# Patient Record
Sex: Female | Born: 1945 | Hispanic: No | Marital: Single | Smoking: Never smoker
Health system: Southern US, Community
[De-identification: ages and names within clinical notes are randomized; demographics above are authoritative.]

## PROBLEM LIST (undated history)

## (undated) DIAGNOSIS — E119 Type 2 diabetes mellitus without complications: Secondary | ICD-10-CM

## (undated) DIAGNOSIS — G4730 Sleep apnea, unspecified: Secondary | ICD-10-CM

## (undated) DIAGNOSIS — I10 Essential (primary) hypertension: Secondary | ICD-10-CM

## (undated) DIAGNOSIS — F32A Depression, unspecified: Secondary | ICD-10-CM

## (undated) DIAGNOSIS — F329 Major depressive disorder, single episode, unspecified: Secondary | ICD-10-CM

---
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
## 2003-06-17 ENCOUNTER — Other Ambulatory Visit: Admission: RE | Admit: 2003-06-17 | Discharge: 2003-06-17 | Payer: Self-pay | Admitting: Dermatology

## 2019-10-16 ENCOUNTER — Other Ambulatory Visit: Payer: Self-pay | Admitting: Obstetrics and Gynecology

## 2019-10-16 DIAGNOSIS — N6452 Nipple discharge: Secondary | ICD-10-CM

## 2019-10-25 ENCOUNTER — Other Ambulatory Visit: Payer: Self-pay | Admitting: Obstetrics and Gynecology

## 2019-10-25 ENCOUNTER — Ambulatory Visit: Payer: Self-pay

## 2019-10-25 ENCOUNTER — Ambulatory Visit
Admission: RE | Admit: 2019-10-25 | Discharge: 2019-10-25 | Disposition: A | Discharge status: Home / Self Care | Payer: Medicare Other | Source: Ambulatory Visit | Attending: Obstetrics and Gynecology | Admitting: Obstetrics and Gynecology

## 2019-10-25 ENCOUNTER — Other Ambulatory Visit: Payer: Self-pay

## 2019-10-25 DIAGNOSIS — N6452 Nipple discharge: Secondary | ICD-10-CM

## 2019-10-30 ENCOUNTER — Other Ambulatory Visit: Payer: Self-pay

## 2019-10-30 ENCOUNTER — Ambulatory Visit
Admission: RE | Admit: 2019-10-30 | Discharge: 2019-10-30 | Disposition: A | Discharge status: Home / Self Care | Payer: Medicare Other | Source: Ambulatory Visit | Attending: Obstetrics and Gynecology | Admitting: Obstetrics and Gynecology

## 2019-10-30 DIAGNOSIS — N6452 Nipple discharge: Secondary | ICD-10-CM

## 2019-10-30 HISTORY — PX: BREAST BIOPSY: SHX20

## 2019-12-21 ENCOUNTER — Other Ambulatory Visit: Payer: Self-pay | Admitting: General Surgery

## 2020-01-01 ENCOUNTER — Encounter (HOSPITAL_BASED_OUTPATIENT_CLINIC_OR_DEPARTMENT_OTHER): Payer: Self-pay | Admitting: General Surgery

## 2020-01-01 ENCOUNTER — Other Ambulatory Visit: Payer: Self-pay

## 2020-01-04 ENCOUNTER — Other Ambulatory Visit (HOSPITAL_COMMUNITY)
Admission: RE | Admit: 2020-01-04 | Discharge: 2020-01-04 | Disposition: A | Discharge status: Home / Self Care | Payer: Medicare Other | Source: Ambulatory Visit | Attending: General Surgery | Admitting: General Surgery

## 2020-01-04 ENCOUNTER — Encounter (HOSPITAL_BASED_OUTPATIENT_CLINIC_OR_DEPARTMENT_OTHER)
Admission: RE | Admit: 2020-01-04 | Discharge: 2020-01-04 | Disposition: A | Discharge status: Home / Self Care | Payer: Medicare Other | Source: Ambulatory Visit | Attending: General Surgery | Admitting: General Surgery

## 2020-01-04 ENCOUNTER — Other Ambulatory Visit: Payer: Self-pay

## 2020-01-04 DIAGNOSIS — Z20822 Contact with and (suspected) exposure to covid-19: Secondary | ICD-10-CM | POA: Diagnosis not present

## 2020-01-04 DIAGNOSIS — Z01812 Encounter for preprocedural laboratory examination: Secondary | ICD-10-CM | POA: Insufficient documentation

## 2020-01-04 LAB — BASIC METABOLIC PANEL
Anion gap: 9 (ref 5–15)
BUN: 18 mg/dL (ref 8–23)
CO2: 30 mmol/L (ref 22–32)
Calcium: 10.4 mg/dL — ABNORMAL HIGH (ref 8.9–10.3)
Chloride: 98 mmol/L (ref 98–111)
Creatinine, Ser: 0.78 mg/dL (ref 0.44–1.00)
GFR calc Af Amer: >60 mL/min (ref >60)
GFR calc non Af Amer: >60 mL/min (ref >60)
Glucose, Bld: 108 mg/dL — ABNORMAL HIGH (ref 70–99)
Potassium: 4.9 mmol/L (ref 3.5–5.1)
Sodium: 137 mmol/L (ref 135–145)

## 2020-01-04 LAB — SARS CORONAVIRUS 2 (TAT 6-24 HRS): SARS Coronavirus 2: NEGATIVE

## 2020-01-08 ENCOUNTER — Ambulatory Visit (HOSPITAL_BASED_OUTPATIENT_CLINIC_OR_DEPARTMENT_OTHER): Payer: Medicare Other | Admitting: Anesthesiology

## 2020-01-08 ENCOUNTER — Ambulatory Visit (HOSPITAL_BASED_OUTPATIENT_CLINIC_OR_DEPARTMENT_OTHER)
Admission: RE | Admit: 2020-01-08 | Discharge: 2020-01-08 | Disposition: A | Discharge status: Home / Self Care | Payer: Medicare Other | Attending: General Surgery | Admitting: General Surgery

## 2020-01-08 ENCOUNTER — Encounter (HOSPITAL_BASED_OUTPATIENT_CLINIC_OR_DEPARTMENT_OTHER)
Admission: RE | Disposition: A | Discharge status: Home / Self Care | Payer: Self-pay | Source: Home / Self Care | Attending: General Surgery

## 2020-01-08 ENCOUNTER — Other Ambulatory Visit: Payer: Self-pay

## 2020-01-08 ENCOUNTER — Encounter (HOSPITAL_BASED_OUTPATIENT_CLINIC_OR_DEPARTMENT_OTHER): Payer: Self-pay | Admitting: General Surgery

## 2020-01-08 DIAGNOSIS — F419 Anxiety disorder, unspecified: Secondary | ICD-10-CM | POA: Insufficient documentation

## 2020-01-08 DIAGNOSIS — Z86718 Personal history of other venous thrombosis and embolism: Secondary | ICD-10-CM | POA: Insufficient documentation

## 2020-01-08 DIAGNOSIS — Z90710 Acquired absence of both cervix and uterus: Secondary | ICD-10-CM | POA: Insufficient documentation

## 2020-01-08 DIAGNOSIS — I10 Essential (primary) hypertension: Secondary | ICD-10-CM | POA: Diagnosis not present

## 2020-01-08 DIAGNOSIS — E119 Type 2 diabetes mellitus without complications: Secondary | ICD-10-CM | POA: Insufficient documentation

## 2020-01-08 DIAGNOSIS — Z86711 Personal history of pulmonary embolism: Secondary | ICD-10-CM | POA: Insufficient documentation

## 2020-01-08 DIAGNOSIS — Z87891 Personal history of nicotine dependence: Secondary | ICD-10-CM | POA: Insufficient documentation

## 2020-01-08 DIAGNOSIS — Z91040 Latex allergy status: Secondary | ICD-10-CM | POA: Diagnosis not present

## 2020-01-08 DIAGNOSIS — N6452 Nipple discharge: Secondary | ICD-10-CM | POA: Diagnosis present

## 2020-01-08 DIAGNOSIS — Z9049 Acquired absence of other specified parts of digestive tract: Secondary | ICD-10-CM | POA: Diagnosis not present

## 2020-01-08 DIAGNOSIS — F329 Major depressive disorder, single episode, unspecified: Secondary | ICD-10-CM | POA: Insufficient documentation

## 2020-01-08 DIAGNOSIS — Z880 Allergy status to penicillin: Secondary | ICD-10-CM | POA: Diagnosis not present

## 2020-01-08 DIAGNOSIS — Z7901 Long term (current) use of anticoagulants: Secondary | ICD-10-CM | POA: Insufficient documentation

## 2020-01-08 DIAGNOSIS — D242 Benign neoplasm of left breast: Secondary | ICD-10-CM | POA: Insufficient documentation

## 2020-01-08 DIAGNOSIS — Z885 Allergy status to narcotic agent status: Secondary | ICD-10-CM | POA: Insufficient documentation

## 2020-01-08 DIAGNOSIS — G4730 Sleep apnea, unspecified: Secondary | ICD-10-CM | POA: Insufficient documentation

## 2020-01-08 HISTORY — DX: Essential (primary) hypertension: I10

## 2020-01-08 HISTORY — DX: Sleep apnea, unspecified: G47.30

## 2020-01-08 HISTORY — PX: BREAST DUCTAL SYSTEM EXCISION: SHX5242

## 2020-01-08 HISTORY — DX: Type 2 diabetes mellitus without complications: E11.9

## 2020-01-08 HISTORY — DX: Depression, unspecified: F32.A

## 2020-01-08 LAB — GLUCOSE, CAPILLARY
Glucose-Capillary: 137 mg/dL — ABNORMAL HIGH (ref 70–99)
Glucose-Capillary: 83 mg/dL (ref 70–99)

## 2020-01-08 LAB — PROTIME-INR
INR: 1.3 — ABNORMAL HIGH (ref 0.8–1.2)
Prothrombin Time: 15.6 seconds — ABNORMAL HIGH (ref 11.4–15.2)

## 2020-01-08 SURGERY — EXCISION DUCTAL SYSTEM BREAST
Anesthesia: General | Site: Breast | Laterality: Left

## 2020-01-08 MED ORDER — CEFAZOLIN SODIUM-DEXTROSE 2-4 GM/100ML-% IV SOLN
2.0000 g | INTRAVENOUS | Status: AC
  Administered 2020-01-08: 2 g via INTRAVENOUS

## 2020-01-08 MED ORDER — ONDANSETRON HCL 4 MG/2ML IJ SOLN
INTRAMUSCULAR | Status: DC | PRN
  Administered 2020-01-08: 4 mg via INTRAVENOUS

## 2020-01-08 MED ORDER — CEFAZOLIN SODIUM-DEXTROSE 2-4 GM/100ML-% IV SOLN
INTRAVENOUS | Status: AC
  Filled 2020-01-08: qty 100

## 2020-01-08 MED ORDER — LIDOCAINE HCL (CARDIAC) PF 100 MG/5ML IV SOSY
PREFILLED_SYRINGE | INTRAVENOUS | Status: DC | PRN
  Administered 2020-01-08: 60 mg via INTRAVENOUS

## 2020-01-08 MED ORDER — LACTATED RINGERS IV SOLN: INTRAVENOUS | Status: DC

## 2020-01-08 MED ORDER — ACETAMINOPHEN 500 MG PO TABS
1000.0000 mg | ORAL_TABLET | ORAL | Status: AC
  Administered 2020-01-08: 1000 mg via ORAL

## 2020-01-08 MED ORDER — ALBUTEROL SULFATE HFA 108 (90 BASE) MCG/ACT IN AERS
INHALATION_SPRAY | RESPIRATORY_TRACT | Status: DC | PRN
  Administered 2020-01-08 (×2): 5 via RESPIRATORY_TRACT

## 2020-01-08 MED ORDER — PHENYLEPHRINE 40 MCG/ML (10ML) SYRINGE FOR IV PUSH (FOR BLOOD PRESSURE SUPPORT)
PREFILLED_SYRINGE | INTRAVENOUS | Status: DC | PRN
  Administered 2020-01-08: 40 ug via INTRAVENOUS
  Administered 2020-01-08 (×2): 80 ug via INTRAVENOUS

## 2020-01-08 MED ORDER — PROPOFOL 10 MG/ML IV BOLUS
INTRAVENOUS | Status: DC | PRN
  Administered 2020-01-08: 150 mg via INTRAVENOUS

## 2020-01-08 MED ORDER — DEXAMETHASONE SODIUM PHOSPHATE 4 MG/ML IJ SOLN
INTRAMUSCULAR | Status: DC | PRN
  Administered 2020-01-08: 5 mg via INTRAVENOUS

## 2020-01-08 MED ORDER — GABAPENTIN 100 MG PO CAPS
ORAL_CAPSULE | ORAL | Status: AC
  Filled 2020-01-08: qty 1

## 2020-01-08 MED ORDER — BUPIVACAINE HCL (PF) 0.25 % IJ SOLN
INTRAMUSCULAR | Status: DC | PRN
  Administered 2020-01-08: 10 mL

## 2020-01-08 MED ORDER — GABAPENTIN 100 MG PO CAPS
100.0000 mg | ORAL_CAPSULE | ORAL | Status: AC
  Administered 2020-01-08: 100 mg via ORAL

## 2020-01-08 MED ORDER — PROPOFOL 10 MG/ML IV BOLUS
INTRAVENOUS | Status: AC
  Filled 2020-01-08: qty 20

## 2020-01-08 MED ORDER — FENTANYL CITRATE (PF) 100 MCG/2ML IJ SOLN
INTRAMUSCULAR | Status: AC
  Filled 2020-01-08: qty 2

## 2020-01-08 MED ORDER — ACETAMINOPHEN 500 MG PO TABS
ORAL_TABLET | ORAL | Status: AC
  Filled 2020-01-08: qty 2

## 2020-01-08 MED ORDER — SUCCINYLCHOLINE CHLORIDE 20 MG/ML IJ SOLN
INTRAMUSCULAR | Status: DC | PRN
  Administered 2020-01-08: 100 mg via INTRAVENOUS

## 2020-01-08 MED ORDER — FENTANYL CITRATE (PF) 100 MCG/2ML IJ SOLN
INTRAMUSCULAR | Status: DC | PRN
  Administered 2020-01-08: 100 ug via INTRAVENOUS

## 2020-01-08 MED ORDER — PHENYLEPHRINE 40 MCG/ML (10ML) SYRINGE FOR IV PUSH (FOR BLOOD PRESSURE SUPPORT)
PREFILLED_SYRINGE | INTRAVENOUS | Status: AC
  Filled 2020-01-08: qty 10

## 2020-01-08 MED ORDER — EPHEDRINE SULFATE-NACL 50-0.9 MG/10ML-% IV SOSY
PREFILLED_SYRINGE | INTRAVENOUS | Status: DC | PRN
  Administered 2020-01-08 (×5): 10 mg via INTRAVENOUS

## 2020-01-08 SURGICAL SUPPLY — 49 items
ADH SKN CLS APL DERMABOND .7 (GAUZE/BANDAGES/DRESSINGS) ×1
APL PRP STRL LF DISP 70% ISPRP (MISCELLANEOUS) ×1
BINDER BREAST LRG (GAUZE/BANDAGES/DRESSINGS) IMPLANT
BINDER BREAST MEDIUM (GAUZE/BANDAGES/DRESSINGS) IMPLANT
BINDER BREAST XLRG (GAUZE/BANDAGES/DRESSINGS) IMPLANT
BINDER BREAST XXLRG (GAUZE/BANDAGES/DRESSINGS) IMPLANT
BLADE SURG 15 STRL LF DISP TIS (BLADE) ×1 IMPLANT
BLADE SURG 15 STRL SS (BLADE) ×2
CANISTER SUCT 1200ML W/VALVE (MISCELLANEOUS) IMPLANT
CHLORAPREP W/TINT 26 (MISCELLANEOUS) ×2 IMPLANT
CLIP VESOCCLUDE SM WIDE 6/CT (CLIP) IMPLANT
COVER BACK TABLE 60X90IN (DRAPES) ×2 IMPLANT
COVER MAYO STAND STRL (DRAPES) ×2 IMPLANT
COVER WAND RF STERILE (DRAPES) IMPLANT
DECANTER SPIKE VIAL GLASS SM (MISCELLANEOUS) IMPLANT
DERMABOND ADVANCED (GAUZE/BANDAGES/DRESSINGS) ×1
DERMABOND ADVANCED .7 DNX12 (GAUZE/BANDAGES/DRESSINGS) IMPLANT
DRAPE LAPAROSCOPIC ABDOMINAL (DRAPES) ×2 IMPLANT
DRAPE UTILITY XL STRL (DRAPES) ×2 IMPLANT
DRSG TEGADERM 4X4.75 (GAUZE/BANDAGES/DRESSINGS) IMPLANT
ELECT COATED BLADE 2.86 ST (ELECTRODE) ×2 IMPLANT
ELECT REM PT RETURN 9FT ADLT (ELECTROSURGICAL) ×2
ELECTRODE REM PT RTRN 9FT ADLT (ELECTROSURGICAL) ×1 IMPLANT
GAUZE SPONGE 4X4 12PLY STRL LF (GAUZE/BANDAGES/DRESSINGS) ×2 IMPLANT
GLOVE BIO SURGEON STRL SZ7 (GLOVE) ×4 IMPLANT
GLOVE BIOGEL PI IND STRL 7.5 (GLOVE) ×1 IMPLANT
GLOVE BIOGEL PI INDICATOR 7.5 (GLOVE) ×3
GOWN STRL REUS W/ TWL LRG LVL3 (GOWN DISPOSABLE) ×3 IMPLANT
GOWN STRL REUS W/TWL LRG LVL3 (GOWN DISPOSABLE) ×6
NDL HYPO 25X1 1.5 SAFETY (NEEDLE) ×1 IMPLANT
NEEDLE HYPO 25X1 1.5 SAFETY (NEEDLE) ×2 IMPLANT
NS IRRIG 1000ML POUR BTL (IV SOLUTION) IMPLANT
PACK BASIN DAY SURGERY FS (CUSTOM PROCEDURE TRAY) ×2 IMPLANT
PENCIL SMOKE EVACUATOR (MISCELLANEOUS) ×2 IMPLANT
RETRACTOR ONETRAX LX 90X20 (MISCELLANEOUS) IMPLANT
SLEEVE SCD COMPRESS KNEE MED (MISCELLANEOUS) ×2 IMPLANT
SPONGE LAP 4X18 RFD (DISPOSABLE) ×2 IMPLANT
STRIP CLOSURE SKIN 1/2X4 (GAUZE/BANDAGES/DRESSINGS) IMPLANT
SUT MNCRL AB 4-0 PS2 18 (SUTURE) IMPLANT
SUT SILK 2 0 SH (SUTURE) ×2 IMPLANT
SUT VIC AB 2-0 SH 27 (SUTURE) ×2
SUT VIC AB 2-0 SH 27XBRD (SUTURE) ×1 IMPLANT
SUT VIC AB 3-0 SH 27 (SUTURE) ×2
SUT VIC AB 3-0 SH 27X BRD (SUTURE) ×1 IMPLANT
SUT VIC AB 5-0 PS2 18 (SUTURE) ×1 IMPLANT
SYR CONTROL 10ML LL (SYRINGE) ×2 IMPLANT
TOWEL GREEN STERILE FF (TOWEL DISPOSABLE) ×2 IMPLANT
TUBE CONNECTING 20X1/4 (TUBING) ×1 IMPLANT
YANKAUER SUCT BULB TIP NO VENT (SUCTIONS) ×1 IMPLANT

## 2020-01-08 NOTE — Anesthesia Preprocedure Evaluation (Addendum)
Anesthesia Evaluation  Patient identified by MRN, date of birth, ID band Patient awake    Reviewed: Allergy & Precautions, NPO status , Patient's Chart, lab work & pertinent test results, reviewed documented beta blocker date and time   Airway Mallampati: II  TM Distance: >3 FB Neck ROM: Full    Dental no notable dental hx. (+) Dental Advisory Given, Partial Upper,    Pulmonary sleep apnea ,    Pulmonary exam normal breath sounds clear to auscultation       Cardiovascular hypertension, Pt. on medications and Pt. 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   Neuro/Psych PSYCHIATRIC DISORDERS Depression negative neurological ROS     GI/Hepatic negative GI ROS, Neg liver ROS,   Endo/Other  diabetes, Type 2, Oral Hypoglycemic Agents  Renal/GU negative Renal ROS  negative genitourinary   Musculoskeletal negative musculoskeletal ROS (+)   Abdominal Normal abdominal exam  (+)   Peds negative pediatric ROS (+)  Hematology negative hematology ROS (+)   Anesthesia Other Findings   Reproductive/Obstetrics negative OB ROS                            Anesthesia Physical Anesthesia Plan  ASA: III  Anesthesia Plan: General   Post-op Pain Management:    Induction: Intravenous  PONV Risk Score and Plan: 3 and Ondansetron, Dexamethasone and Treatment may vary due to age or medical condition  Airway Management Planned: LMA  Additional Equipment: None  Intra-op Plan:   Post-operative Plan: Extubation in OR  Informed Consent: I have reviewed the patients History and Physical, chart, labs and discussed the procedure including the risks, benefits and alternatives for the proposed anesthesia with the patient or authorized representative who has indicated his/her understanding and acceptance.     Dental advisory given  Plan  Discussed with: CRNA  Anesthesia Plan Comments:         Anesthesia Quick Evaluation

## 2020-01-08 NOTE — Anesthesia Procedure Notes (Signed)
Procedure Name: Intubation Date/Time: 01/08/2020 1:46 PM Performed by: Raenette Rover, CRNA Pre-anesthesia Checklist: Patient identified, Emergency Drugs available, Suction available and Patient being monitored Patient Re-evaluated:Patient Re-evaluated prior to induction Oxygen Delivery Method: Circle system utilized Preoxygenation: Pre-oxygenation with 100% oxygen Induction Type: IV induction Laryngoscope Size: Mac and 3 Grade View: Grade I Tube type: Oral Tube size: 7.0 mm Number of attempts: 1 Airway Equipment and Method: Stylet Placement Confirmation: ETT inserted through vocal cords under direct vision,  positive ETCO2 and breath sounds checked- equal and bilateral Secured at: 22 cm Tube secured with: Tape Dental Injury: Teeth and Oropharynx as per pre-operative assessment

## 2020-01-08 NOTE — Op Note (Signed)
Preoperative diagnosis:left breast bloody nipple discharge Postoperative diagnosis: Same as above Procedure:Leftbreastcentral duct excision Surgeon: Dr. Serita Grammes Anesthesia: General  Specimens:left breast central duct excision marked with paint Estimated blood [id number] cc Complications: None Drains: None Sponge and count was correct at completion Disposition to recovery in stable condition  Indications:73 yof referred by Dr Vista Deck for left nipple dc.she is on coumadin for remote dvt history. she has noted spontaneous left bloody nipple dc about four weeks ago when she woke up with brb on the sheets. there was soreness associated with it. it leaked again several days after that as well. she has no trauma. mm shows b density breasts. right is negative. left is negative. US shows a left breast retroareolar dilated duct with 5 mm mass vs debris. biopsy was recommended. 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 this was concordant.   Procedure: After informed consent was obtained shewas placed under general anesthesia after transitioning from an LMA due to laryngospasm. She was given antibiotics. SCDs were in place. She was then prepped and draped in the standard sterile surgical fashion. A surgical timeout was then performed.  I infilltrated marcaine around the areola. I then located the discharging duct. I cannulated this with a lacrimal duct probe.  I then made a periareolar incision. I proceeding to remove the central duct system including the lacrimal duct probe and this system of ducts.  I painted this and passed off the table. I then obtained hemostasis. I closed this with 2-0 vicryl, 3-0 vicryl and 5-0 monocryl. I then placed glue and steristrips.

## 2020-01-08 NOTE — Transfer of Care (Signed)
Immediate Anesthesia Transfer of Care Note  Patient: Gabrielle Diaz  Procedure(s) Performed: left breast central duct excision (Left Breast)  Patient Location: PACU  Anesthesia Type:General  Level of Consciousness: awake, alert , oriented and patient cooperative  Airway & Oxygen Therapy: Patient Spontanous Breathing and Patient connected to face mask oxygen  Post-op Assessment: Report given to RN and Post -op Vital signs reviewed and stable  Post vital signs: Reviewed and stable  Last Vitals:  Vitals Value Taken Time  BP 137/76 01/08/20 1418  Temp 36.4 C 01/08/20 1418  Pulse 93 01/08/20 1428  Resp 24 01/08/20 1428  SpO2 100 % 01/08/20 1428  Vitals shown include unvalidated device data.  Last Pain:  Vitals:   01/08/20 1418  TempSrc:   PainSc: 0-No pain      Patients Stated Pain Goal: 3 ([id number])  Complications: No apparent anesthesia complications

## 2020-01-08 NOTE — H&P (Signed)
74 yof referred by Dr Vista Deck for left nipple dc. she is friend of a patient of mine. she is on coumadin for remote dvt history. she has noted spontaneous left bloody nipple dc about four weeks ago when she woke up with brb on the sheets. there was soreness associated with it. it leaked again several days after that as well. she has no trauma. mm shows b density breasts. right is negative. left is negative. US shows a left breast retroareolar dilated duct with 5 mm mass vs debris. biopsy was recommended. 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 this was concordant. she was referred to see me  Past Surgical History Sabino Gasser, [place]; 12/11/2019 9:57 AM) Breast Biopsy  Left. Cesarean Section - Multiple  Gallbladder Surgery - Open  Hysterectomy (not due to cancer) - Complete   Diagnostic Studies History Sabino Gasser, [place]; 12/11/2019 9:57 AM) Colonoscopy  5-10 years ago Mammogram  within last year Pap Smear  >5 years ago  Allergies Sabino Gasser, [place]; 12/11/2019 10:00 AM) Codeine/Codeine Derivatives  Penicillins  Latex  Allergies Reconciled   Social History Sabino Gasser, CMA; 12/11/2019 9:57 AM) Alcohol use  Remotely quit alcohol use. Caffeine use  Carbonated beverages, Coffee. No drug use  Tobacco use  Former smoker.  Family History Sabino Gasser, [place]; 12/11/2019 9:57 AM) Alcohol Abuse  Mother. Arthritis  Father, Mother, Son. Cerebrovascular Accident  Father. Heart Disease  Father. Migraine Headache  Father.  Pregnancy / Birth History Sabino Gasser, [place]; 12/11/2019 9:57 AM) Age at menarche  74 years. Age of menopause  <45 Gravida  4 Length (months) of breastfeeding  3-6 Maternal age  27-30 Para  3  Other Problems Sabino Gasser, [place]; 12/11/2019 9:57 AM) Alcohol Abuse  Anxiety Disorder  Cancer  Cholelithiasis  Depression  Diabetes Mellitus  High blood pressure  Oophorectomy   Bilateral. Pulmonary Embolism / Blood Clot in Legs  Sleep Apnea     Review of Systems Sabino Gasser CMA; 12/11/2019 9:57 AM) General Not Present- Appetite Loss, Chills, Fatigue, Fever, Night Sweats, Weight Gain and Weight Loss. Skin Not Present- Change in Wart/Mole, Dryness, Hives, Jaundice, New Lesions, Non-Healing Wounds, Rash and Ulcer. HEENT Present- Wears glasses/contact lenses. Not Present- Earache, Hearing Loss, Hoarseness, Nose Bleed, Oral Ulcers, Ringing in the Ears, Seasonal Allergies, Sinus Pain, Sore Throat, Visual Disturbances and Yellow Eyes. Respiratory Present- Snoring. Not Present- Bloody sputum, Chronic Cough, Difficulty Breathing and Wheezing. Breast Present- Nipple Discharge. Not Present- Breast Mass, Breast Pain and Skin Changes. Cardiovascular Not Present- Chest Pain, Difficulty Breathing Lying Down, Leg Cramps, Palpitations, Rapid Heart Rate, Shortness of Breath and Swelling of Extremities. Gastrointestinal Not Present- Abdominal Pain, Bloating, Bloody Stool, Change in Bowel Habits, Chronic diarrhea, Constipation, Difficulty Swallowing, Excessive gas, Gets full quickly at meals, Hemorrhoids, Indigestion, Nausea, Rectal Pain and Vomiting. Female Genitourinary Present- Frequency. Not Present- Nocturia, Painful Urination, Pelvic Pain and Urgency. Musculoskeletal Present- Joint Pain, Joint Stiffness, Muscle Pain and Muscle Weakness. Not Present- Back Pain and Swelling of Extremities. Neurological Present- Trouble walking and Weakness. Not Present- Decreased Memory, Fainting, Headaches, Numbness, Seizures, Tingling and Tremor. Psychiatric Present- Anxiety and Depression. Not Present- Bipolar, Change in Sleep Pattern, Fearful and Frequent crying. Hematology Present- Blood Thinners. Not Present- Easy Bruising, Excessive bleeding, Gland problems, HIV and Persistent Infections.  Vitals Sabino Gasser CMA; 12/11/2019 10:01 AM) 12/11/2019 10:00 AM Weight: 184 lb Height: 60in Body  Surface Area: 1.8 m Body Mass Index: 35.93 kg/m  Temp.:  97.78F(Tympanic)  Pulse: 87 (Regular)  BP: 130/82 (Sitting, Left Arm, Standard) Physical Exam Rolm Bookbinder MD; 12/11/2019 2:23 PM) General Mental Status-Alert. Orientation-Oriented X3. Eye Sclera/Conjunctiva - Bilateral-No scleral icterus. Chest and Lung Exam Chest and lung exam reveals -quiet, even and easy respiratory effort with no use of accessory muscles. Breast Breast Lump-No Palpable Breast Mass. Note: left breast with bloody nipple dc expressed from single duct Neurologic Neurologic evaluation reveals -alert and oriented x 3 with no impairment of recent or remote memory. Lymphatic Head & Neck General Head & Neck Lymphatics: Bilateral - Description - Normal. Axillary General Axillary Region: Bilateral - Description - Normal. Note: no [place] adenopathy   Assessment & Plan Rolm Bookbinder MD; 12/12/2019 5:50 PM) BLOODY DISCHARGE FROM LEFT NIPPLE (N64.52) Story: Left central duct excision we discussed options including observation vs surgery. she understands there is small chance of malignancy although likely benign given ongoing bloody unilateral spontaneous discharge. I discussed surgery as outpatient and removing the discharging ductal system. risks and recovery discussed. will check with her pcp about preop issues and then schedule. will ask for coumadin recs.

## 2020-01-08 NOTE — Anesthesia Postprocedure Evaluation (Signed)
Anesthesia Post Note  Patient: Hilton Algiere  Procedure(s) Performed: left breast central duct excision (Left Breast)     Patient location during evaluation: PACU Anesthesia Type: General Level of consciousness: awake and alert, oriented and patient cooperative Pain management: pain level controlled Vital Signs Assessment: post-procedure vital signs reviewed and stable Respiratory status: spontaneous breathing, nonlabored ventilation and respiratory function stable Cardiovascular status: blood pressure returned to baseline and stable Postop Assessment: no apparent nausea or vomiting Anesthetic complications: no    Last Vitals:  Vitals:   01/08/20 1222 01/08/20 1418  BP: 122/85 137/76  Pulse: 73 91  Resp: 18 12  Temp: (!) 36.3 C 36.4 C  SpO2: 99% 92%    Last Pain:  Vitals:   01/08/20 1418  TempSrc:   PainSc: 0-No pain                 Pervis Hocking

## 2020-01-08 NOTE — Interval H&P Note (Signed)
History and Physical Interval Note:  01/08/2020 1:23 PM  Gabrielle Diaz  has presented today for surgery, with the diagnosis of left breast bloody nipple n/c.  The various methods of treatment have been discussed with the patient and family. After consideration of risks, benefits and other options for treatment, the patient has consented to  Procedure(s) with comments: left breast central duct excision (Left) - general and lma anesthesia as a surgical intervention.  The patient's history has been reviewed, patient examined, no change in status, stable for surgery.  I have reviewed the patient's chart and labs.  Questions were answered to the patient's satisfaction.     Rolm Bookbinder

## 2020-01-08 NOTE — Discharge Instructions (Signed)
[facility] Office Phone Number [phone]  BREAST BIOPSY/ PARTIAL MASTECTOMY: POST OP INSTRUCTIONS Take 400 mg of ibuprofen every 8 hours or 650 mg tylenol every 6 hours for next 72 hours then as needed. Use ice several times daily also. Always review your discharge instruction sheet given to you by the facility where your surgery was performed.  IF YOU HAVE DISABILITY OR FAMILY LEAVE FORMS, YOU MUST BRING THEM TO THE OFFICE FOR PROCESSING.  DO NOT GIVE THEM TO YOUR DOCTOR.  1. A prescription for pain medication may be given to you upon discharge.  Take your pain medication as prescribed, if needed.  If narcotic pain medicine is not needed, then you may take acetaminophen (Tylenol), naprosyn (Alleve) or ibuprofen (Advil) as needed. 2. Take your usually prescribed medications unless otherwise directed 3. If you need a refill on your pain medication, please contact your pharmacy.  They will contact our office to request authorization.  Prescriptions will not be filled after 5pm or on week-ends. 4. You should eat very light the first 24 hours after surgery, such as soup, crackers, pudding, etc.  Resume your normal diet the day after surgery. 5. Most patients will experience some swelling and bruising in the breast.  Ice packs and a good support bra will help.  Wear the breast binder provided or a sports bra for 72 hours day and night.  After that wear a sports bra during the day until you return to the office. Swelling and bruising can take several days to resolve.  6. It is common to experience some constipation if taking pain medication after surgery.  Increasing fluid intake and taking a stool softener will usually help or prevent this problem from occurring.  A mild laxative (Milk of Magnesia or Miralax) should be taken according to package directions if there are no bowel movements after 48 hours. 7. Unless discharge instructions indicate otherwise, you may remove your bandages 48  hours after surgery and you may shower at that time.  You may have steri-strips (small skin tapes) in place directly over the incision.  These strips should be left on the skin for 7-10 days and will come off on their own.  If your surgeon used skin glue on the incision, you may shower in 24 hours.  The glue will flake off over the next 2-3 weeks.  Any sutures or staples will be removed at the office during your follow-up visit. 8. ACTIVITIES:  You may resume regular daily activities (gradually increasing) beginning the next day.  Wearing a good support bra or sports bra minimizes pain and swelling.  You may have sexual intercourse when it is comfortable. a. You may drive when you no longer are taking prescription pain medication, you can comfortably wear a seatbelt, and you can safely maneuver your car and apply brakes. b. RETURN TO WORK:  ______________________________________________________________________________________ 9. You should see your doctor in the office for a follow-up appointment approximately two weeks after your surgery.  Your doctor's nurse will typically make your follow-up appointment when she calls you with your pathology report.  Expect your pathology report 3-4 business days after your surgery.  You may call to check if you do not hear from us after three days. 10. OTHER INSTRUCTIONS: _______________________________________________________________________________________________ _____________________________________________________________________________________________________________________________________ _____________________________________________________________________________________________________________________________________ _____________________________________________________________________________________________________________________________________  WHEN TO CALL DR WAKEFIELD: 1. Fever over 101.0 2. Nausea and/or vomiting. 3. Extreme swelling or  bruising. 4. Continued bleeding from incision. 5. Increased pain, redness, or drainage from the incision.  The clinic   staff is available to answer your questions during regular business hours.  Please don't hesitate to call and ask to speak to one of the nurses for clinical concerns.  If you have a medical emergency, go to the nearest emergency room or call 911.  A surgeon from [facility] is always on call at the hospital.  For further questions, please visit [email or web address]  7 pm next dose for Tylenol   Post Anesthesia Home Care Instructions  Activity: Get plenty of rest for the remainder of the day. A responsible individual must stay with you for 24 hours following the procedure.  For the next 24 hours, DO NOT: -Drive a car -Paediatric nurse -Drink alcoholic beverages -Take any medication unless instructed by your physician -Make any legal decisions or sign important papers.  Meals: Start with liquid foods such as gelatin or soup. Progress to regular foods as tolerated. Avoid greasy, spicy, heavy foods. If nausea and/or vomiting occur, drink only clear liquids until the nausea and/or vomiting subsides. Call your physician if vomiting continues.  Special Instructions/Symptoms: Your throat may feel dry or sore from the anesthesia or the breathing tube placed in your throat during surgery. If this causes discomfort, gargle with warm salt water. The discomfort should disappear within 24 hours.  If you had a scopolamine patch placed behind your ear for the management of post- operative nausea and/or vomiting:  1. The medication in the patch is effective for 72 hours, after which it should be removed.  Wrap patch in a tissue and discard in the trash. Wash hands thoroughly with soap and water. 2. You may remove the patch earlier than 72 hours if you experience unpleasant side effects which may include dry mouth, dizziness or visual disturbances. 3. Avoid  touching the patch. Wash your hands with soap and water after contact with the patch.

## 2020-01-09 ENCOUNTER — Encounter: Payer: Self-pay | Admitting: *Deleted

## 2020-01-10 LAB — SURGICAL PATHOLOGY

## 2020-02-01 ENCOUNTER — Ambulatory Visit: Payer: Medicare Other | Attending: Internal Medicine

## 2020-02-01 DIAGNOSIS — Z23 Encounter for immunization: Secondary | ICD-10-CM | POA: Insufficient documentation

## 2020-02-01 NOTE — Progress Notes (Signed)
   Covid-19 Vaccination Clinic  Name:  Gabrielle Diaz    MRN: [id number] DOB: 1946-08-13  02/01/2020  Ms. Park was observed post Covid-19 immunization for 15 minutes without incidence. She was provided with Vaccine Information Sheet and instruction to access the V-Safe system.   [name] was instructed to call 911 with any severe reactions post vaccine: Marland Kitchen Difficulty breathing  . Swelling of your face and throat  . A fast heartbeat  . A bad rash all over your body  . Dizziness and weakness    Immunizations Administered    Name Date Dose VIS Date Route   Pfizer COVID-19 Vaccine 02/01/2020  3:33 PM 0.3 mL 11/16/2019 Intramuscular   Manufacturer: [place]   Lot: [id number]   [place]: [id number]

## 2020-02-27 ENCOUNTER — Ambulatory Visit: Payer: Medicare Other | Attending: Internal Medicine

## 2020-02-27 DIAGNOSIS — Z23 Encounter for immunization: Secondary | ICD-10-CM

## 2020-02-27 NOTE — Progress Notes (Signed)
   Covid-19 Vaccination Clinic  Name:  Ronnie Waldroup    MRN: [id number] DOB: August 07, 1946  02/27/2020  Ms. Comte was observed post Covid-19 immunization for 15 minutes without incident. She was provided with Vaccine Information Sheet and instruction to access the V-Safe system.   Ms. Jou was instructed to call 911 with any severe reactions post vaccine: Marland Kitchen Difficulty breathing  . Swelling of face and throat  . A fast heartbeat  . A bad rash all over body  . Dizziness and weakness   Immunizations Administered    Name Date Dose VIS Date Route   Pfizer COVID-19 Vaccine 02/27/2020 12:45 PM 0.3 mL 11/16/2019 Intramuscular   Manufacturer: [place]   Lot: [id number]   [place]: [id number]

## 2021-01-14 ENCOUNTER — Other Ambulatory Visit: Payer: Self-pay | Admitting: General Surgery

## 2021-01-14 DIAGNOSIS — Z1231 Encounter for screening mammogram for malignant neoplasm of breast: Secondary | ICD-10-CM

## 2021-03-05 ENCOUNTER — Ambulatory Visit
Admission: RE | Admit: 2021-03-05 | Discharge: 2021-03-05 | Disposition: A | Discharge status: Home / Self Care | Payer: Medicare Other | Source: Ambulatory Visit | Attending: General Surgery | Admitting: General Surgery

## 2021-03-05 ENCOUNTER — Other Ambulatory Visit: Payer: Self-pay

## 2021-03-05 DIAGNOSIS — Z1231 Encounter for screening mammogram for malignant neoplasm of breast: Secondary | ICD-10-CM

## 2022-01-26 ENCOUNTER — Other Ambulatory Visit: Payer: Self-pay | Admitting: General Surgery

## 2022-01-26 DIAGNOSIS — Z1231 Encounter for screening mammogram for malignant neoplasm of breast: Secondary | ICD-10-CM

## 2022-03-09 ENCOUNTER — Ambulatory Visit
Admission: RE | Admit: 2022-03-09 | Discharge: 2022-03-09 | Disposition: A | Discharge status: Home / Self Care | Payer: Medicare Other | Source: Ambulatory Visit | Attending: General Surgery | Admitting: General Surgery

## 2022-03-09 DIAGNOSIS — Z1231 Encounter for screening mammogram for malignant neoplasm of breast: Secondary | ICD-10-CM

## 2023-01-18 IMAGING — MG MM DIGITAL SCREENING BILAT W/ TOMO AND CAD
8 series · 8 of 24 positions shown · non-contrast
Comparison: Previous exam(s).

CLINICAL DATA: Screening.

EXAM:
DIGITAL SCREENING BILATERAL MAMMOGRAM WITH TOMOSYNTHESIS AND CAD
TECHNIQUE: Bilateral screening digital craniocaudal and mediolateral oblique
mammograms were obtained. Bilateral screening digital breast
tomosynthesis was performed. The images were evaluated with
computer-aided detection.

[R MLO synth-2D]
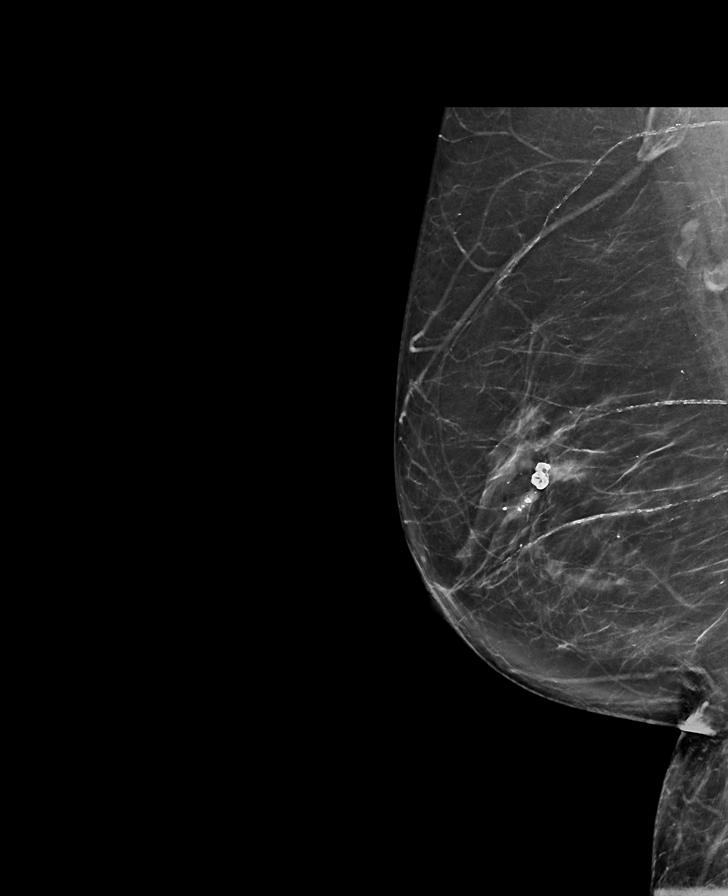

[L CC synth-2D]
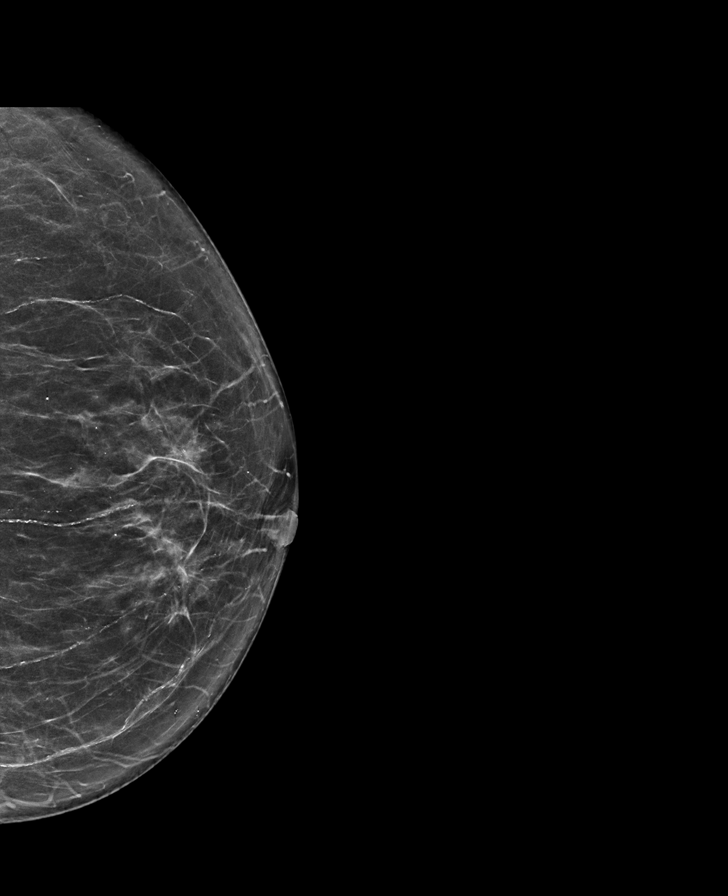

[R CC synth-2D]
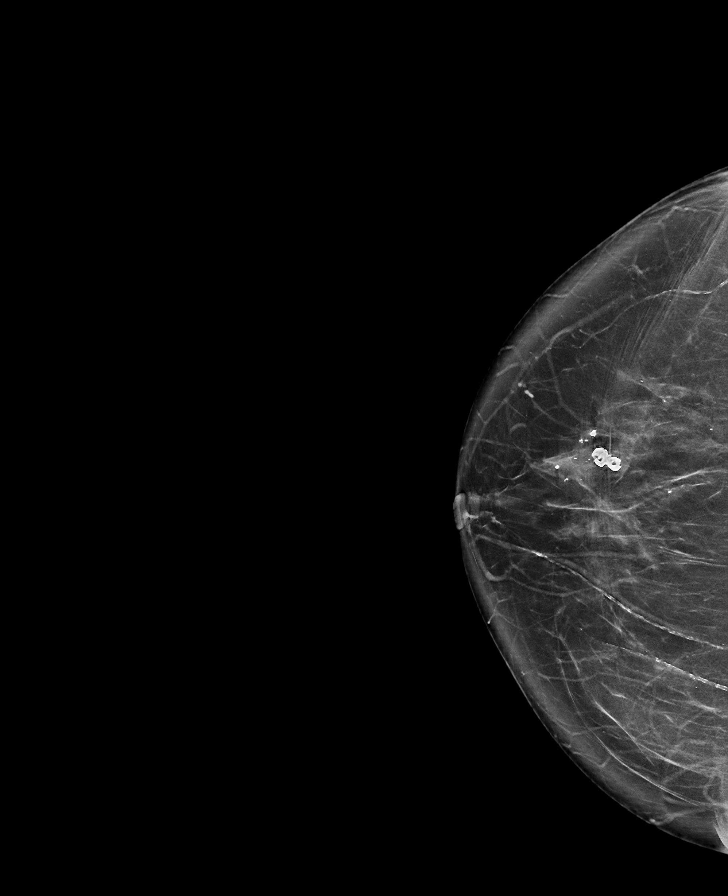

[L MLO synth-2D]
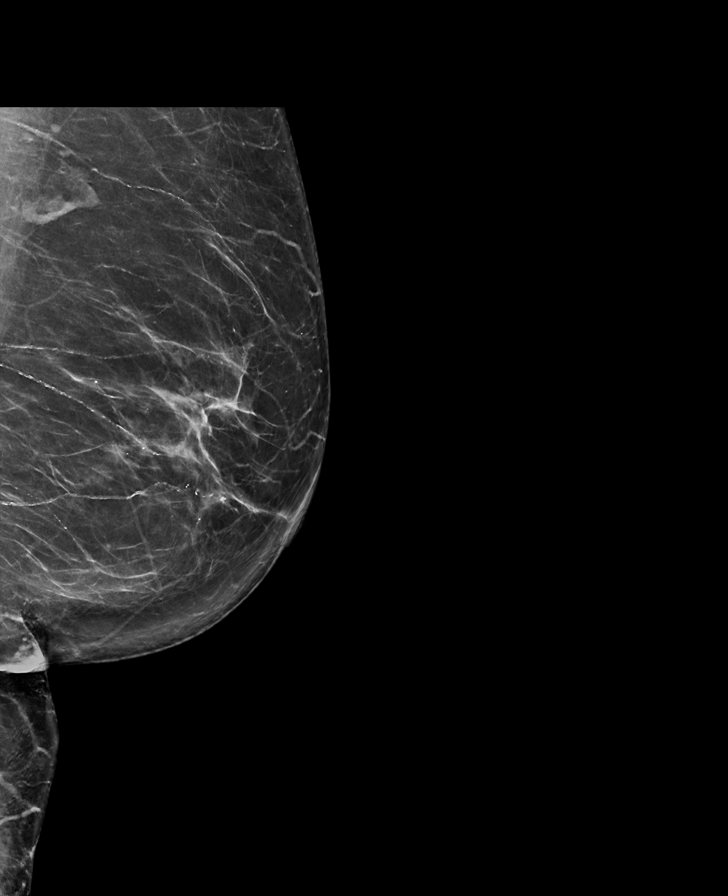

[L CC tomo · tomo slice 35/70.0]
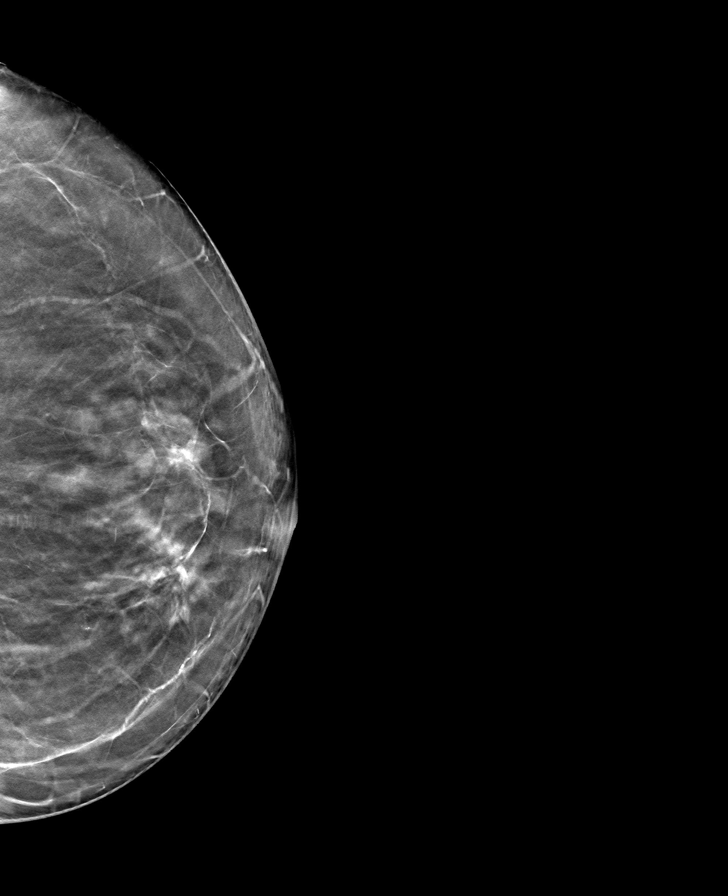

[L MLO tomo · tomo slice 40/79.0]
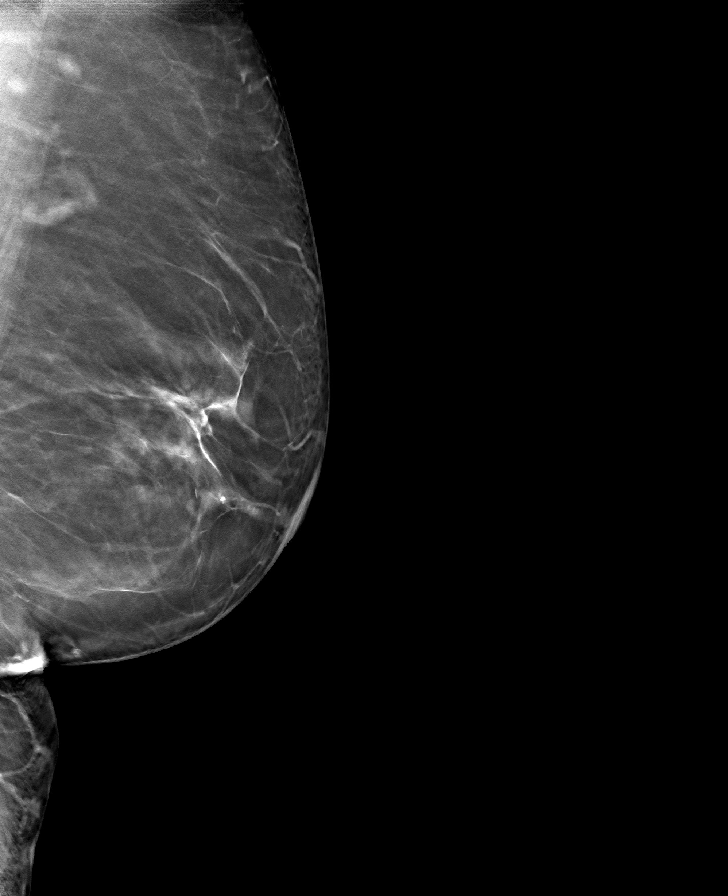

[R MLO tomo · tomo slice 41/82.0]
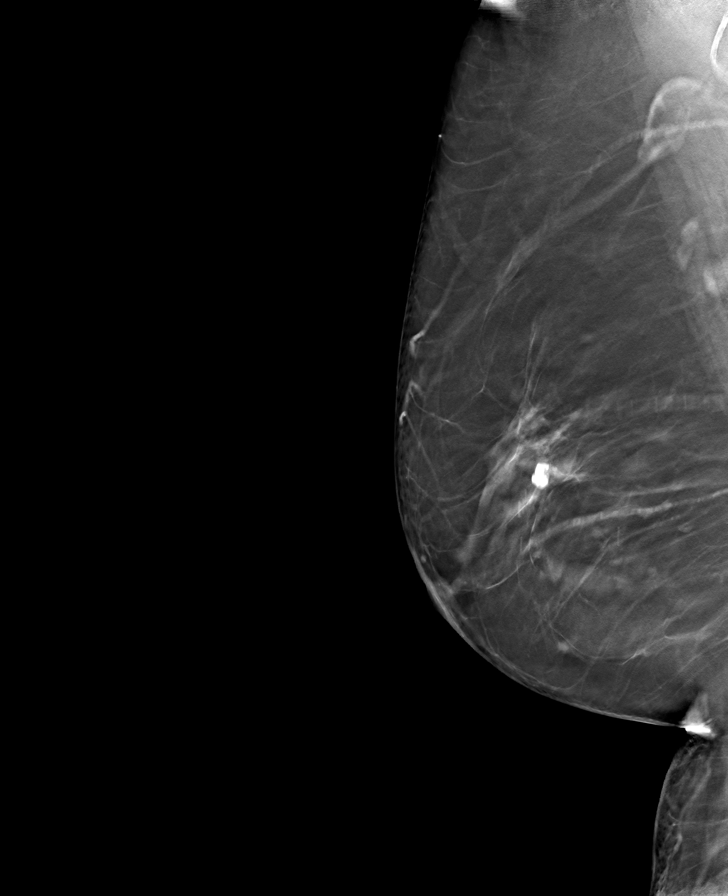

[R CC tomo · tomo slice 41/82.0]
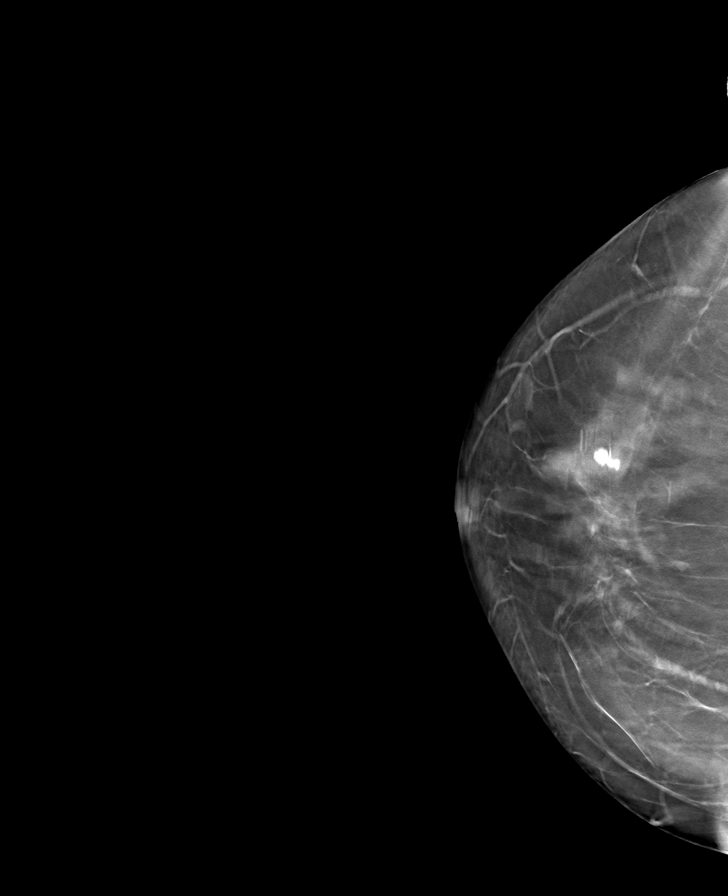

[8 of 24 positions shown; findings below may reference images not displayed]

ACR Breast Density Category b: There are scattered areas of
fibroglandular density.
FINDINGS: There are no findings suspicious for malignancy.
IMPRESSION: No mammographic evidence of malignancy. A result letter of this
screening mammogram will be mailed directly to the patient.

RECOMMENDATION:
Screening mammogram in one year. (Code:51-O-LD2)

BI-RADS CATEGORY  1: Negative.

## 2023-01-28 ENCOUNTER — Other Ambulatory Visit: Payer: Self-pay | Admitting: General Surgery

## 2023-01-28 DIAGNOSIS — Z1231 Encounter for screening mammogram for malignant neoplasm of breast: Secondary | ICD-10-CM

## 2023-03-16 ENCOUNTER — Ambulatory Visit
Admission: RE | Admit: 2023-03-16 | Discharge: 2023-03-16 | Disposition: A | Discharge status: Home / Self Care | Payer: Medicare Other | Source: Ambulatory Visit | Attending: General Surgery | Admitting: General Surgery

## 2023-03-16 DIAGNOSIS — Z1231 Encounter for screening mammogram for malignant neoplasm of breast: Secondary | ICD-10-CM

## 2024-03-02 ENCOUNTER — Other Ambulatory Visit: Payer: Self-pay | Admitting: Family Medicine

## 2024-03-02 DIAGNOSIS — Z1231 Encounter for screening mammogram for malignant neoplasm of breast: Secondary | ICD-10-CM

## 2024-03-16 ENCOUNTER — Ambulatory Visit

## 2024-03-22 ENCOUNTER — Ambulatory Visit

## 2024-04-18 ENCOUNTER — Ambulatory Visit

## 2024-04-20 ENCOUNTER — Ambulatory Visit

## 2024-04-27 ENCOUNTER — Ambulatory Visit

## 2024-05-04 ENCOUNTER — Ambulatory Visit
Admission: RE | Admit: 2024-05-04 | Discharge: 2024-05-04 | Disposition: A | Discharge status: Home / Self Care | Source: Ambulatory Visit | Attending: Family Medicine | Admitting: Family Medicine

## 2024-05-04 DIAGNOSIS — Z1231 Encounter for screening mammogram for malignant neoplasm of breast: Secondary | ICD-10-CM
